# Patient Record
Sex: Female | Born: 1950 | Race: White | Hispanic: No | Marital: Single
Health system: Southern US, Community
[De-identification: ages and names within clinical notes are randomized; demographics above are authoritative.]

---

## 2006-04-01 ENCOUNTER — Emergency Department: Payer: Self-pay | Admitting: General Practice

## 2007-03-22 ENCOUNTER — Emergency Department: Payer: Self-pay | Admitting: Emergency Medicine

## 2009-03-28 ENCOUNTER — Emergency Department: Payer: Self-pay | Admitting: Unknown Physician Specialty

## 2013-03-19 ENCOUNTER — Observation Stay: Payer: Self-pay | Admitting: Student

## 2013-03-19 LAB — COMPREHENSIVE METABOLIC PANEL
Albumin: 2.9 g/dL — ABNORMAL LOW (ref 3.4–5.0)
Alkaline Phosphatase: 120 U/L (ref 50–136)
Bilirubin,Total: 0.2 mg/dL (ref 0.2–1.0)
Calcium, Total: 9.5 mg/dL (ref 8.5–10.1)
Chloride: 99 mmol/L (ref 98–107)
Co2: 26 mmol/L (ref 21–32)
EGFR (African American): >60
Glucose: 315 mg/dL — ABNORMAL HIGH (ref 65–99)
Osmolality: 283 (ref 275–301)
SGOT(AST): 14 U/L — ABNORMAL LOW (ref 15–37)
SGPT (ALT): 19 U/L (ref 12–78)

## 2013-03-19 LAB — CBC
HCT: 38.7 % (ref 35.0–47.0)
MCHC: 33.2 g/dL (ref 32.0–36.0)
MCV: 94 fL (ref 80–100)
Platelet: 267 10*3/uL (ref 150–440)
RBC: 4.13 10*6/uL (ref 3.80–5.20)
RDW: 14.4 % (ref 11.5–14.5)
WBC: 13.1 10*3/uL — ABNORMAL HIGH (ref 3.6–11.0)

## 2013-03-19 LAB — URINALYSIS, COMPLETE
Bilirubin,UR: NEGATIVE
Ketone: NEGATIVE
Nitrite: POSITIVE
Protein: 30
RBC,UR: 2 /HPF (ref 0–5)
Specific Gravity: 1.015 (ref 1.003–1.030)
Squamous Epithelial: 1
WBC UR: 111 /HPF (ref 0–5)

## 2013-03-19 LAB — HEMOGLOBIN A1C: Hemoglobin A1C: 11.6 % — ABNORMAL HIGH (ref 4.2–6.3)

## 2013-03-20 DIAGNOSIS — I059 Rheumatic mitral valve disease, unspecified: Secondary | ICD-10-CM

## 2013-03-20 LAB — CBC WITH DIFFERENTIAL/PLATELET
Basophil #: 0.1 10*3/uL (ref 0.0–0.1)
Basophil %: 0.7 %
Eosinophil %: 0.9 %
HCT: 38.7 % (ref 35.0–47.0)
HGB: 13.1 g/dL (ref 12.0–16.0)
Lymphocyte #: 5.6 10*3/uL — ABNORMAL HIGH (ref 1.0–3.6)
Lymphocyte %: 45.5 %
MCH: 31.6 pg (ref 26.0–34.0)
Monocyte %: 4.7 %
Neutrophil %: 48.2 %
RBC: 4.16 10*6/uL (ref 3.80–5.20)
RDW: 14.4 % (ref 11.5–14.5)
WBC: 12.3 10*3/uL — ABNORMAL HIGH (ref 3.6–11.0)

## 2013-03-20 LAB — LIPID PANEL: Cholesterol: 251 mg/dL — ABNORMAL HIGH (ref 0–200)

## 2013-03-21 LAB — URINE CULTURE

## 2014-11-07 ENCOUNTER — Emergency Department: Payer: Self-pay | Admitting: Emergency Medicine

## 2015-01-06 NOTE — Discharge Summary (Signed)
PATIENT NAME:  Mary Hopkins, Mary Hopkins MR#:  [id number] DATE OF BIRTH:  1950/09/22  DATE OF ADMISSION:  03/19/2013 DATE OF DISCHARGE:  03/20/2013  THE PATIENT WALKED OUT AGAINST MEDICAL ADVICE ON 03/20/2013.   CHIEF COMPLAINT: Right arm and hand weakness, along with slurred speech.   PRIMARY CARE PHYSICIAN: Dr.  Charlies Constable at [facility].   DIAGNOSES AT TIME OF DISCHARGE: 1.  Possible transient ischemic attack. 2.  Uncontrolled diabetes, with a hemoglobin A1c of 11.6.  3.  Hypertension.  4.  Positive urinalysis, possibly from interstitial cystitis.  5.  Hyperlipidemia.  6.  Hypertriglyceridemia.  7.  History of transient ischemic attacks in the past.  8.  History of stroke in the past.  9.  Fibromyalgia.  10.  Arthritis.  11.  Gastroesophageal reflux disease.  12.  Chronic low back pain.  13.  History of nephrolithiasis.  14.  History of peripheral arterial disease, status post stent placement in 02/2013.  15.  History of osteoarthritis.  16.  History of carpal tunnel syndrome.  17.  Ongoing tobacco abuse; smokes 2 packs a day.   MEDICATIONS AT THE TIME OF WALKING OUT AGAINST MEDICAL ADVICE: Atenolol 50 mg daily. Atorvastatin, was increased to 80 mg at bedtime, baclofen 10 mg q.24 h., Cipro 400 mg q.12h., Docusate sodium 100 mg daily, hydrochlorothiazide 25 mg daily, Protonix 40 mg b.i.d., prednisone 40 mg daily, aspirin 81 mg daily, Lovenox 40 mg subcutaneous daily, Lantus 71 units at bedtime, with sliding-scale insulin, metformin 1000 mg b.i.d.     SIGNIFICANT LABS AND IMAGING: Initial creatinine was 0.97, BUN of 19. Initial glucose 315, potassium 3.8. Hemoglobin A1c 11.6. HDL 30, triglycerides 349, LDL 151.   LFTs showed AST 14, albumin of 2.9; otherwise within normal limits. Troponin negative. Initial WBC 13.1, hemoglobin 12.9, platelets 267.   Urine cultures have been positive with Klebsiella, sensitive to ceftriaxone, Cipro, Levaquin and Bactrim. UA was positive with  nitrites, 2+ leukocyte esterase, 2 RBCs, 111 WBC, 1 bacteria.   Echocardiogram showed no source of CVA. EF of 60% to 65%, normal LV systolic function, normal RVSP.  CT of the head without contrast on admission showed no acute intracranial process. There was an old right basilar ganglia lacunar infarct. There was generalized cerebral atrophy. There was periventricular white matter low attenuation likely secondary to microangiopathy.   Repeat CT of the head the following day on July 5th showed a subtle focus of hypoattenuation and subcortical white matter of the left frontoparietal lobe; is not definitely appreciated on prior, though this could be secondary to differences in slight flexion; however the possibility of an acute infarct in the subcortical white matter is raised.   ULTRASOUND: Carotid Dopplers bilaterally shows findings concerning for at least 50 to 75% stenosis in the proximal left internal carotid artery. Further evaluation could be provided with CT angio of the neck.   HISTORY OF PRESENT ILLNESS AND HOSPITAL COURSE: For full details of H and P, please see dictation by Dr. Elpidio Anis on July 4. Briefly this is a 64 year old female with a history of TIA, osteoarthritis, diabetes, hypertension, right carotid artery stenosis, who came in with right upper arm weakness and slurred speech,  3 episodes on-and-off. She had 3 episodes over a 2-hour period, and the symptoms resolved. She was on Plavix, started around 2004 or so. The patient has a history of a right-sided carotid artery stenosis for which she had undergone endarterectomy along with angioplasty in the past. When she came in  she was asymptomatic. [facility] by Dr. Cyril LoosenKinner in the ER, who recommended getting a carotid Doppler. She had a spinal stimulator and could not get an MRI, and initial CAT scan was negative. She was admitted to the hospitalist service for observation with frequent neurology consults, and an  echocardiogram, repeat CAT scan, and ultrasound of the carotids were obtained the results of which are dictated above. The patient was neurologically intact, showing no signs of stroke.   Her labs showed multiple abnormality abnormalities including uncontrolled diabetes with elevated hemoglobin A1c and high triglycerides, and LDL for her goal. She was transitioned to Aggrenox from Plavix, and her simvastatin was increased, however THE PATIENT WALKED OUT AGAINST MEDICAL ADVICE. A vascular consult was placed, however the patient was adamant that she wanted to go and [facility]. Again she walked out against gas medical advice before a full workup was completed. She had been started on Cipro as she had a positive UA and she had mild leukocytosis however, urine culture at the time she  WALKED OUT AGAINST MEDICAL ADVICE. Nonetheless, this was likely an asymptomatic bacteriuria. She has a chronic UTI, she states, and at this point she was not symptomatic, without hematuria or dysuria, or fevers.   Total Time Spent: Thirty-five minutes.     ____________________________ Krystal EatonShayiq Berel Najjar, MD sa:dm D: 03/21/2013 08:04:01 ET T: 03/21/2013 09:49:21 ET JOB#: [id number]  cc: Krystal EatonShayiq Charlie Seda, MD, <Dictator> Dr. Charlies Constableonald Spencer  [facility] Marcelle SmilingSHAYIQ [facility] MD ELECTRONICALLY SIGNED 04/12/2013 14:32

## 2015-01-06 NOTE — H&P (Signed)
PATIENT NAME:  KENNADI, ALBANY MR#:  [id number] DATE OF BIRTH:  November 16, 1950  DATE OF ADMISSION:  03/19/2013  PRIMARY CARE PHYSICIAN: Charlies Constable, MD, at [facility].   CHIEF COMPLAINT: Right arm and hand weakness along with slurred speech.   HISTORY OF PRESENTING ILLNESS: A 64 year old female patient with prior history of TIAs, Takayasu arteritis, diabetes, hypertension and right carotid artery stenosis, presents to the Emergency Room with complaints of 3 episodes of on-and-off right upper arm weakness and slurred speech. She had 3 episodes over a 2-hour period. Presently, symptoms are resolved. She feels back to baseline. Her last similar symptoms were in  2004 when she was started on Plavix. The patient had right carotid artery stenosis, for which she had endarterectomy along with angioplasty. The patient has not had symptoms since. Dr. Cyril Loosen of Emergency Room has gotten in touch with [facility] neurology who suggested patient get carotid Dopplers, as it has been a while since she got this test. The patient has a spinal stimulator, cannot get MRIs. Her CT scan of the head today without contrast showed a small right basal ganglion old lacunar stroke, nothing acute, no masses.   The patient did not have any dysphagia, change in vision, lightheadedness, syncope, dizziness, hearing loss or other focal abnormalities or seizures. No associated nausea, vomiting. No pain.   PAST MEDICAL HISTORY: 1.  Hypertension.  2.  TIAs.  3.  Chronic low back pain.  4.  Type 2 diabetes mellitus.  5.  GERD.  6.  Arthritis.  7.  Recurrent UTIs.  8.  Fibromyalgia.  9.  Nephrolithiasis.  10.  Peripheral arterial disease, status post stent placement in June 2014.  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  12.  Carpal tunnel syndrome.  13.  Tobacco abuse.   FAMILY HISTORY: Diabetes, hypertension.   SOCIAL HISTORY: The patient smokes 2 packs a day. No alcohol. No illicit drugs. Ambulates on her own.   CODE STATUS:  Full code.   ALLERGIES: No known drug allergies.   REVIEW OF SYSTEMS: CONSTITUTIONAL: No fever, fatigue.  EYES: No blurred vision, pain, redness.  EARS, NOSE, THROAT: No tinnitus, ear pain, hearing loss.  RESPIRATORY: No cough, wheeze, hemoptysis.  CARDIOVASCULAR: No chest pain, orthopnea, edema.  GASTROINTESTINAL: No nausea, vomiting, diarrhea, abdominal pain.  GENITOURINARY: No dysuria, hematuria, frequency.  ENDOCRINE: No polyuria, nocturia, thyroid problems. Has diabetes.  HEMATOLOGIC AND LYMPHATIC: No anemia, easy bruising, bleeding.  INTEGUMENTARY: No acne, rash, lesions.  MUSCULOSKELETAL: Has chronic back pain and arthritis. Has a spinal stimulator.  NEUROLOGICAL: Had right upper extremity weakness, slurred speech, which resolved.  PSYCHIATRIC: No anxiety, depression.   HOME MEDICATIONS: Include:  1.  Lansoprazole 30 mg oral 2 times a day.  2.  Prednisone 4 mg oral once a day.  3.  Atenolol 50 mg oral once a day. 4.  Baclofen 10 mg oral once a day in the evening.  5.  Docusate sodium 100 mg oral daily.  6.  Fluconazole 200 mg oral daily as needed.  7.  Hydrochlorothiazide 25 mg once a day.  8.  Insulin glargine 71 units subcutaneous once a day.  9.  Atorvastatin 40 mg oral once a day.  10.  Lorazepam 1 mg oral 2 times a day as needed.  11.  Metformin 1000 mg oral 2 times a day.  12.  Insulin aspart sliding scale. 13.  Paroxetine 20 mg oral once a day.  14.  Plavix 75 mg oral once a day.  PHYSICAL EXAMINATION: VITAL SIGNS: Temperature 99.4, pulse of 68, blood pressure 121/54, saturating 96% on room air. GENERAL: Obese Caucasian female patient lying in bed, comfortable, conversational, cooperative with exam.  PSYCHIATRIC: Alert, oriented x 3. Mood and affect appropriate. Judgment intact.  HEENT: Atraumatic, normocephalic. Oral mucosa moist and pink. External ears and nose normal. No pallor. No icterus. Pupils bilaterally equal and reactive to affect.  NECK: Supple. No  thyromegaly. No palpable lymph nodes. Trachea midline. No JVD. The patient does have right-sided carotid bruit. CARDIOVASCULAR: S1, S2, without any murmurs. Peripheral pulses 2+. No edema.  RESPIRATORY: Normal work of breathing. Clear to auscultation both sides. GASTROINTESTINAL: Soft abdomen, nontender. Bowel sounds present. No organomegaly palpable.  SKIN: Warm and dry. No petechiae, rash, ulcers.  MUSCULOSKELETAL: No joint swelling, redness, effusion of the large joints. Normal muscle tone.  NEUROLOGICAL: Motor strength five 5 out of 5 in upper and lower extremities. Babinskis  downgoing. Cranial nerves II through XII intact. Sensation to fine touch intact all over. Gait not tested.  LYMPHATIC: No cervical or supraclavicular lymphadenopathy.   LABORATORY, DIAGNOSTIC AND RADIOLOGICAL DATA: Glucose of 315, BUN 19, creatinine 0.97, sodium 134, potassium 3.8, GFR greater than 60. AST, ALT, alkaline phosphatase, bilirubin normal. Troponin less than 0.02. WBC 13.1, hemoglobin 12.9, platelets 267. Urinalysis shows 1+ bacteria, 111 WBCs.   EKG shows normal sinus rhythm with T wave inversions in V1 to V5. No ST elevation. No old EKG to compare with.   CT scan of the head shows no acute abnormalities. Has an old lacunar right basal ganglia CVA.   ASSESSMENT AND PLAN: 1.  Right upper extremity weakness and slurred speech in a patient with prior history of transient ischemic attacks and duration over 2 hours,  along with hypertension, diabetes: Is being admitted onto a tele floor for work-up of transient ischemic attack. The patient cannot get an MRI. On discussing with [facility], decision was made to get carotid Dopplers and monitor on the floor. The patient will be admitted onto a tele floor with neuro checks every 4 hours. Symptoms have resolved at this time. I will change her Plavix to Aggrenox 2 times a day. The patient will be continued on her statin medication. Will get  carotid Dopplers and 2-D echocardiogram. The patient does have right-sided carotid bruit. Have asked the patient to have a low threshold to watch out for any new stroke-like symptoms in the future.  2.  EKG changes with T wave inversions in V1 to V5: No old EKG to compare with. The patient does not complain of any shortness of breath or chest pain. She will likely need an elective stress test with her primary care physician.  3.  Hypertension: Well controlled.  4.  Diabetes mellitus, insulin-dependent: Uncontrolled with glucose of 315. Will resume her Lantus. Put her on sliding scale insulin. Also continue the metformin. The patient will be on a diabetic diet. I have counseled her regarding being compliant with her diabetic diet.  5.  Tobacco abuse: The patient smokes 2 packs a day. Counseled her to quit smoking for greater than 3 minutes. She mentions she would like to quit starting today. Will place her on a nicotine patch.  6.  Deep vein thrombosis prophylaxis with Lovenox.   CODE STATUS: Full code.   TIME SPENT: Today on this case was 45 minutes.    ____________________________ Molinda BailiffSrikar R. Nyeema Want, MD srs:jm D: 03/19/2013 17:52:05 ET T: 03/19/2013 19:11:50 ET JOB#: [id number]  cc:  Kristoffer Bala R. Elpidio Anis, MD, <Dictator> [facility] Charlies Constable MD   Wardell Heath West Bali MD ELECTRONICALLY SIGNED 03/19/2013 20:05

## 2015-01-10 ENCOUNTER — Inpatient Hospital Stay
Admit: 2015-01-10 | Disposition: A | Discharge status: Home / Self Care | Payer: Self-pay | Attending: Internal Medicine | Admitting: Internal Medicine

## 2015-01-10 LAB — URINALYSIS, COMPLETE
Bilirubin,UR: NEGATIVE
Glucose,UR: NEGATIVE mg/dL (ref 0–75)
KETONE: NEGATIVE
Nitrite: NEGATIVE
Ph: 7 (ref 4.5–8.0)
Specific Gravity: 1.023 (ref 1.003–1.030)

## 2015-01-10 LAB — COMPREHENSIVE METABOLIC PANEL
ALK PHOS: 91 U/L
ANION GAP: 9 (ref 7–16)
Albumin: 2.8 g/dL — ABNORMAL LOW
BUN: 17 mg/dL
Bilirubin,Total: <0.1 mg/dL — ABNORMAL LOW
CALCIUM: 9.3 mg/dL
CO2: 22 mmol/L
CREATININE: 1.02 mg/dL — AB
Chloride: 101 mmol/L
EGFR (Non-African Amer.): 59 — ABNORMAL LOW
Glucose: 210 mg/dL — ABNORMAL HIGH
POTASSIUM: 4.3 mmol/L
SGOT(AST): 24 U/L
SGPT (ALT): 11 U/L — ABNORMAL LOW
SODIUM: 132 mmol/L — AB
Total Protein: 7.9 g/dL

## 2015-01-10 LAB — TROPONIN I: Troponin-I: <0.03 ng/mL

## 2015-01-10 LAB — CBC
HCT: 23.7 % — ABNORMAL LOW (ref 35.0–47.0)
HGB: 7.1 g/dL — AB (ref 12.0–16.0)
MCH: 22 pg — ABNORMAL LOW (ref 26.0–34.0)
MCHC: 29.9 g/dL — ABNORMAL LOW (ref 32.0–36.0)
MCV: 73 fL — ABNORMAL LOW (ref 80–100)
PLATELETS: 476 10*3/uL — AB (ref 150–440)
RBC: 3.23 10*6/uL — ABNORMAL LOW (ref 3.80–5.20)
RDW: 20.3 % — AB (ref 11.5–14.5)
WBC: 13.5 10*3/uL — ABNORMAL HIGH (ref 3.6–11.0)

## 2015-01-11 LAB — BASIC METABOLIC PANEL
Anion Gap: 7 (ref 7–16)
BUN: 14 mg/dL
CO2: 26 mmol/L
Calcium, Total: 9.5 mg/dL
Chloride: 106 mmol/L
Creatinine: 0.96 mg/dL
EGFR (African American): >60
EGFR (Non-African Amer.): >60
Glucose: 43 mg/dL — ABNORMAL LOW
POTASSIUM: 3.8 mmol/L
Sodium: 138 mmol/L

## 2015-01-11 LAB — CBC WITH DIFFERENTIAL/PLATELET
Basophil #: 0.1 10*3/uL (ref 0.0–0.1)
Basophil %: 0.8 %
Eosinophil #: 0.1 10*3/uL (ref 0.0–0.7)
Eosinophil %: 1.3 %
HCT: 26.2 % — ABNORMAL LOW (ref 35.0–47.0)
HGB: 8.1 g/dL — ABNORMAL LOW (ref 12.0–16.0)
Lymphocyte #: 2.8 10*3/uL (ref 1.0–3.6)
Lymphocyte %: 24.2 %
MCH: 23.1 pg — AB (ref 26.0–34.0)
MCHC: 31 g/dL — AB (ref 32.0–36.0)
MCV: 75 fL — ABNORMAL LOW (ref 80–100)
MONO ABS: 0.7 x10 3/mm (ref 0.2–0.9)
Monocyte %: 5.7 %
Neutrophil #: 7.8 10*3/uL — ABNORMAL HIGH (ref 1.4–6.5)
Neutrophil %: 68 %
PLATELETS: 394 10*3/uL (ref 150–440)
RBC: 3.51 10*6/uL — ABNORMAL LOW (ref 3.80–5.20)
RDW: 20.4 % — ABNORMAL HIGH (ref 11.5–14.5)
WBC: 11.5 10*3/uL — ABNORMAL HIGH (ref 3.6–11.0)

## 2015-01-12 LAB — CBC WITH DIFFERENTIAL/PLATELET
BASOS ABS: 0.1 10*3/uL (ref 0.0–0.1)
Basophil %: 1.1 %
Eosinophil #: 0.1 10*3/uL (ref 0.0–0.7)
Eosinophil %: 0.9 %
HCT: 25.9 % — AB (ref 35.0–47.0)
HGB: 7.9 g/dL — ABNORMAL LOW (ref 12.0–16.0)
LYMPHS PCT: 32.1 %
Lymphocyte #: 3.2 10*3/uL (ref 1.0–3.6)
MCH: 23 pg — ABNORMAL LOW (ref 26.0–34.0)
MCHC: 30.4 g/dL — ABNORMAL LOW (ref 32.0–36.0)
MCV: 76 fL — ABNORMAL LOW (ref 80–100)
MONOS PCT: 5.2 %
Monocyte #: 0.5 x10 3/mm (ref 0.2–0.9)
NEUTROS ABS: 6.1 10*3/uL (ref 1.4–6.5)
Neutrophil %: 60.7 %
Platelet: 362 10*3/uL (ref 150–440)
RBC: 3.43 10*6/uL — ABNORMAL LOW (ref 3.80–5.20)
RDW: 20.3 % — ABNORMAL HIGH (ref 11.5–14.5)
WBC: 10.1 10*3/uL (ref 3.6–11.0)

## 2015-01-12 LAB — BASIC METABOLIC PANEL
Anion Gap: 6 — ABNORMAL LOW (ref 7–16)
BUN: 19 mg/dL
CO2: 27 mmol/L
CREATININE: 1.15 mg/dL — AB
Calcium, Total: 9.2 mg/dL
Chloride: 105 mmol/L
EGFR (Non-African Amer.): 51 — ABNORMAL LOW
GFR CALC AF AMER: 59 — AB
Glucose: 110 mg/dL — ABNORMAL HIGH
Potassium: 4.1 mmol/L
Sodium: 138 mmol/L

## 2015-01-13 LAB — URINALYSIS, COMPLETE
Bilirubin,UR: NEGATIVE
Glucose,UR: NEGATIVE mg/dL (ref 0–75)
KETONE: NEGATIVE
Nitrite: NEGATIVE
PH: 5 (ref 4.5–8.0)
SPECIFIC GRAVITY: 1.014 (ref 1.003–1.030)

## 2015-01-13 LAB — BASIC METABOLIC PANEL
Anion Gap: 7 (ref 7–16)
BUN: 30 mg/dL — ABNORMAL HIGH
CALCIUM: 9.1 mg/dL
CO2: 27 mmol/L
CREATININE: 1.23 mg/dL — AB
Chloride: 106 mmol/L
GFR CALC AF AMER: 54 — AB
GFR CALC NON AF AMER: 47 — AB
Glucose: 159 mg/dL — ABNORMAL HIGH
Potassium: 4.2 mmol/L
Sodium: 140 mmol/L

## 2015-01-13 LAB — HEMOGLOBIN A1C: Hemoglobin A1C: 6.9 % — ABNORMAL HIGH

## 2015-01-14 LAB — URINE CULTURE

## 2015-01-15 NOTE — Consult Note (Signed)
Details:   - GI Note;  Likely d/c tomorrow.  I recommend GI clinic f/u for consideration of colonoscpy to r/o malignancy and CV fistula.   Electronic Signatures: Dow Adolphein, Baylin Gamblin (MD)  (Signed 28-Apr-16 17:16)  Authored: Details   Last Updated: 28-Apr-16 17:16 by Dow Adolphein, Chrissi Crow (MD)

## 2015-01-15 NOTE — Consult Note (Addendum)
PATIENT NAME:  Mary Hopkins, Mary Hopkins MR#:  [id number] DATE OF BIRTH:  [birth year]  INFECTIOUS DISEASE CONSULTATION   DATE OF CONSULTATION:  01/12/2015.  REFERRING PHYSICIAN:  Dr. Amado Coe.  CONSULTING PHYSICIAN:  Stann Mainland. Sampson Goon, MD.  REASON FOR CONSULTATION:  Recurrent urinary tract infection.   HISTORY OF PRESENT ILLNESS:  This is a very pleasant 64 year old female with a history of lupus, Takayasu arteritis, peripheral artery disease, nephrolithiasis, and recurrent urinary tract infections.  She was admitted April 26 with urinary retention, suprapubic pain, and lethargy.  She had a Foley catheter placed.  She had a CT showing bladder wall thickening and inflammatory changes as well as bilateral moderate hydroureteronephrosis.  There was no evidence of obstructing stones at the time.  The patient was admitted.  Has been seen by Dr. Apolinar Junes as well as by GI due to some concern for possible colovesicular fistula.  There did not seem to be evidence of this at that point.   The patient reports she has had at least 5 urinary tract infections since December.  Prior to that, she used to get 1 or 2 a year.  She reports even as a child she had issues.  She had to have her urethra dilated at times, she reports.  Over the last 4 months, she has received multiple courses of antibiotics including Bactrim, Cipro, and Keflex for approximately 10 days at a time.  She says the symptoms would improve, but when she was off of them, they would recur.  She would usually get change in the color of her urine as well as pain.  She has had some low-grade fevers with it.  She has had some diarrhea but has never been admitted for this.    PAST MEDICAL HISTORY:  1. Lupus.  2. Hypertension.  3. Possible Takayasu arteritis.  4. Peripheral artery disease.  5. Chronic back pain.   6. Diabetes.  7. GERD.  8. Arthritis.  9. Fibromyalgia.  10.  Nephrolithiasis.  11.  Recurrent urinary tract infections as above.  12.  Carpal  tunnel syndrome.  13.  Carotid artery stenosis, status post stenting.  14.  Tobacco abuse.  15.  Colonic polyps with dysplasia.   PAST SURGICAL HISTORY:  The patient has had multiple GU surgeries, including bladder sling in the mid 1990s and then excision of the bladder sling due to urinary retention.  She has had InterStim placement.  She has had multiple cystoscopies and colonoscopies.   SOCIAL HISTORY:  She smokes a pack a day.  No alcohol or other drugs.  Lives alone.   FAMILY HISTORY:  Positive for bladder cancer.   ALLERGIES:  LISINOPRIL.  REVIEW OF SYSTEMS:  Eleven systems reviewed and negative except as per HPI.    ANTIBIOTICS SINCE ADMISSION:  Include ceftriaxone.   PHYSICAL EXAMINATION:  VITAL SIGNS:  Temperature 98.5, pulse 59, blood pressure 152/72, respirations 20, saturation 91% on room air.  GENERAL:  She is pleasant, interactive, in no acute distress.  HEENT:  Pupils reactive.  Sclerae anicteric.  Oropharynx clear.  NECK:  Supple.  HEART:  Regular.  LUNGS:  Clear to auscultation bilaterally.  ABDOMEN:  Soft.  She does have some mild tenderness in the suprapubic region.  NEUROLOGIC:  She is alert and oriented x3.  Grossly nonfocal neuro exam.  GENITOURINARY:  She has a Foley catheter in place.  SKIN:  No rashes.    LABORATORY DATA:  On admission April 26, creatinine was 1.02; currently it is 1.15.  Sodium was 132, currently 138.  Other electrolytes were normal.  LFTs showed just a low albumin 2.8, otherwise normal.  White count on admission 13.5 April 26; currently April 28, 10.1.  Hemoglobin 7.9, platelets 362, 000.  Urinalysis on admission:  To numerous to count white cells.  Urine culture is being held for possible pathogens.  I do not have access to her prior cultures at this time but will try to review them.  CT of the abdomen and pelvis April 26:  Marked circumferential bladder wall thickening with surrounding inflammatory changes concerning for cystitis.  There is  heterogeneous material posteriorly in the bladder which may represent infectious disease, however, underlying tumor is not excluded, and then resultant moderate bilateral hydronephrosis.   IMPRESSION:  A 64 year old female with history of multiple medical problems including recurrent urinary tract infections for many years, now with 5 in the last several months.  She has been seen by urology after she was admitted now with urinary retention.  She definitely has a urinary tract infection at this time and culture is pending.  Currently on ceftriaxone and white count is improving and clinically somewhat improved.   RECOMMENDATIONS:   1. I agree with urological recommendations for further workup which can be done as an outpatient when she clinically improves.   2. At this point, I would suggest continuing ceftriaxone.  I would not discharge until urine culture results are available.  At that time, suggest appropriate transition to sensitive oral agent for at least a 14-day course.   She would likely benefit from suppression as well following that.   Thanks for the consult.  I will be glad to follow with you.    ____________________________ Stann Mainlandavid P. Sampson GoonFitzgerald, MD dpf:kc D: 01/12/2015 14:57:21 ET T: 01/12/2015 15:36:30 ET JOB#: [id number]  cc: Stann Mainlandavid P. Sampson GoonFitzgerald, MD, <Dictator> Esbeidy Mclaine Sampson GoonFITZGERALD MD ELECTRONICALLY SIGNED 01/17/2015 10:23

## 2015-01-15 NOTE — Consult Note (Addendum)
PATIENT NAME:  Mary Hopkins, Mary Hopkins MR#:  [id number] DATE OF BIRTH:  [birth year]  DATE OF CONSULTATION:  01/11/2015  REFERRING PHYSICIAN:  Milagros Loll, MD.   CONSULTING PHYSICIAN:  Dow Adolph, MD  REASON FOR CONSULTATION: Bladder wall thickening, recurrent UTI, concern for colovesicular fistula.   HISTORY OF PRESENT ILLNESS: Mary Hopkins is a 64 year old female with a past medical history notable for hypertension, TIA, type 2 diabetes, recurrent UTIs, who is presenting to the Emergency Room for evaluation of suprapubic pain and urinary retention. Mary Hopkins reports that she has had multiple urinary tract infections over the past several weeks. The past few days she has had severe urinary retention and has been only able to have a few drops of urine. This prompted her to present to the Emergency Room.   In the Emergency Room she did have a CT scan showing bladder wall thickening with a possible bladder tumor.   In the setting of this the admitting team was concerned about the possibility of a colovesicular fistula and GI was consulted.   Mary Hopkins denies any overt blood in her stools or any black stools. She did have a colonoscopy 3 years ago with adenomatous polyps and had 3 year recommended followup.  She is late on this. However it was not done because her vascular surgeon did not want her to come off of Plavix at that time for the test.   PAST MEDICAL HISTORY:  1. Hypertension.  2. TIA.   3. DM 2.  4. GERD.  5. Arthritis.  6. Recurrent UTIs.  7. Fibromyalgia.  8. Nephrolithiasis.  9. Peripheral arterial disease status post stent in 2014.  10. Takayasu arteritis.  11. Carpal tunnel syndrome.  12. Tobacco abuse.  13. Right carotid stenosis status post stent.   FAMILY HISTORY: No family history of GI malignancy that she is aware of.   SOCIAL HISTORY: She is an ongoing smoker. No alcohol.   ALLERGIES: LISINOPRIL.   HOME MEDICATIONS:  1. Baclofen 10 mg nightly.   2. Losartan 50 daily.  3. Metoprolol 25 b.i.d.  4. Aspirin 81 daily.  5. Plavix 75 daily.  6. Gabapentin 300 b.i.d.  7. Humalog 10.  8. Hydrochlorothiazide 25 daily.  9. Hydroxychloroquine 200 mg 2 times a day.  10. Lantus 75 daily.  11. Metformin 1000 twice a day.  12. Paroxetine 20 mg 2.5 daily.   REVIEW OF SYSTEMS:   Ten system review is conducted. It is negative except as stated in the HPI.    PHYSICAL EXAMINATION:  VITAL SIGNS: Temperature is 98.3. Pulse is 62. Respirations are 18. Blood pressure 152/75. Pulse oximetry is 92% on room air.   GENERAL: Alert and oriented times 4.  No acute distress. Appears stated age. HEENT: Normocephalic/atraumatic. Extraocular movements are intact. Anicteric. NECK: Soft, supple. JVP appears normal. No adenopathy. CHEST: Clear to auscultation. No wheeze or crackle. Respirations unlabored. HEART: Regular. No murmur, rub, or gallop.  Normal S1 and S2. ABDOMEN: Positive for abdominal obesity. There is also some mild suprapubic tenderness. Soft, nondistended.  Normal active bowel sounds in all four quadrants.  No organomegaly. No masses EXTREMITIES: No swelling, well perfused. SKIN: No rash or lesion. Skin color, texture, turgor normal. NEUROLOGICAL: Grossly intact. PSYCHIATRIC: Normal tone and affect. MUSCULOSKELETAL: No joint swelling or erythema.   LABORATORY DATA: Sodium is 132, potassium 4.3, BUN 17, creatinine 1.02. The liver enzymes, albumin is 2.8, total bilirubin normal, rest of the liver enzymes are normal. White count is 11.5, hemoglobin is  8.1, hematocrit is 26, platelets are 4000, MCV is 75.    The CT scan was as follows: There is a marked circumferential bladder wall thickening with surrounding inflammatory change concerning for cystitis. Heterogeneous material posteriorly in the bladder may represent infectious debris, however underlying tumor is not excluded, resultant  moderate bilateral hydroureteronephrosis.   ASSESSMENT AND PLAN:   Bladder wall thickening, recurrent urinary tract infections, positive fecal occult blood test:  There is no evidence of colonic thickening, inflammation, or diverticulosis on the CT. There is also no evidence of air in the bladder. The lack of colonic involvement would make a colovesicular fistula or colonic malignancy less likely. I do suspect this is likely a primary urinary tract issue. However it would not be unreasonable to perform a flexible sigmoidoscopy or a colonoscopy to rule out the possibility of colonic involvement although I believe this is less likely. The timing of this will depend on urology plan and the timing of a cystoscopy.   RECOMMENDATIONS:  1.  Clear liquids for now.  2.  Continue to monitor hemoglobin.  3.  Possible colonoscopy on Friday, but this will depend on urology's plans and possible scheduling of a cystoscopy. It is also possible to do this study as an outpatient. From a GI standpoint she does not need to remain in the hospital.   Thank you for this consult.     ____________________________ Dow AdolphMatthew Nolita Kutter, MD mr:bu D: 01/11/2015 17:23:39 ET T: 01/11/2015 18:45:00 ET JOB#: [id number]  cc: Dow AdolphMatthew Clinton Wahlberg, MD, <Dictator> Kathalene FramesMATTHEW G Louvenia Golomb MD ELECTRONICALLY SIGNED 01/28/2015 17:19

## 2015-01-15 NOTE — H&P (Signed)
PATIENT NAME:  Mary Hopkins, RABALAIS MR#:  [id number] DATE OF BIRTH:  Feb 07, 1951  DATE OF ADMISSION:  01/10/2015  PRIMARY CARE PROVIDER: Charlies Constable, MD in [facility].   VASCULAR SURGEON: Dr. Carlyon Shadow at [facility].   CHIEF COMPLAINT: Suprapubic pain, weakness, and urinary retention.   HISTORY OF PRESENTING ILLNESS: A 64 year old Caucasian female patient with history of CVA, right carotid stenosis, Takayasu arteritis, and recurrent UTI, presents to the Emergency Room complaining of suprapubic pain, urinary retention, and weakness. The patient mentions that she has had 5 UTIs since December. With last UTI, was treated with ciprofloxacin about 2 weeks prior. Her symptoms did improve, but recurred. At this point, the patient is unable to urinate well. She urinates only a few drops each time, has had increasing suprapubic pain, presented to the Emergency Room. Here, she has been found to have significant anemia with stool positive for blood. A CT scan of the abdomen and pelvis shows bladder wall thickening with a possible bladder tumor.   The patient has not noticed any blood in her stool. She does mention that she tends to have a little blood in her urine whenever she has a urine infection, but no frank blood.   She had a colonoscopy 3 years prior at [facility] when she had polypectomy and was told she had precancerous lesions. She was supposed to get a colonoscopy, but her vascular surgeon did not want to take her off the Plavix for her colonoscopy.   The patient had a Foley catheter placed here in the Emergency Room, which showed significant amount of debris, possibly stool, with extremely cloudy urine.   PAST MEDICAL HISTORY:  1.  Hypertension.  2.  TIA. 3.  Chronic low back pain.  4.  Type 2 diabetes mellitus.  5.  GERD. 6.  Arthritis. 7.  Recurrent UTIs.  8.  Fibromyalgia.  9.  Nephrolithiasis. 10.  Peripheral arterial disease, status post stent placement in June 2014.   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  Carpal tunnel syndrome.  13.  Tobacco abuse.  14.  Right carotid stenosis, status post stent.  15.  Colon polyps with dysplasia.   FAMILY HISTORY: Diabetes and hypertension.   SOCIAL HISTORY: The patient continues to smoke a pack a day. No alcohol. No illicit drugs. Ambulates on her own. Lives alone.   CODE STATUS: Full code.   ALLERGIES: LISINOPRIL.  REVIEW OF SYSTEMS: CONSTITUTIONAL: Complains of fatigue, weakness.  EYES: No blurred vision, pain or redness.  EARS, NOSE, AND THROAT: No tinnitus, ear pain, hearing loss.  RESPIRATORY: No cough, wheeze, hemoptysis.  CARDIOVASCULAR: No chest pain, orthopnea, edema.  GASTROINTESTINAL: Has lower abdominal pain along with some nausea, blood in her stool.  GENITOURINARY: Has urinary retention, dysuria, decreased urine output.  ENDOCRINE: No polyuria, nocturia, thyroid problems. Has diabetes.  HEMATOLOGIC AND LYMPHATIC: Has anemia. No easy bruising, bleeding.  INTEGUMENTARY: No acne, rash, lesion.  MUSCULOSKELETAL: Has chronic back pain, arthritis with spinal stimulator.  NEUROLOGIC: No focal weakness, numbness.  PSYCHIATRIC: No anxiety or depression.   HOME MEDICATIONS:  1.  Baclofen 10 mg at night as needed.  2.  Losartan 50 mg daily.  3.  Metoprolol 25 mg b.i.d.  4.  Aspirin 81 mg daily. 5.  Plavix 75 mg daily.  6.  Gabapentin 300 mg oral 2 times a day.  7.  Humalog 10 units sliding scale.  8.  Hydrochlorothiazide 25 mg daily.  9.  Hydroxychloroquine 200 mg 2 times a  day.  10.  Lantus 75 units subcutaneous once a day.  11.  Metformin 1000 mg oral 2 times a day.  12.  Paroxetine 20 mg 2.5 tablets oral once a day.   PHYSICAL EXAMINATION:  VITAL SIGNS: Shows temperature of 98.7, pulse 80, blood pressure 119/64, saturating 98% on room air.  GENERAL: Obese Caucasian female patient lying in bed, anxious.  HEENT: Atraumatic, normocephalic. Oral mucosa moist and pale. External ears and nose normal.  Pallor positive. No icterus. Pupils bilaterally equal and reactive to light.  NECK: Supple. No thyromegaly. No palpable lymph nodes. Trachea midline. No carotid bruit, JVD. Has scar on the right carotid surgery area.  CARDIOVASCULAR: S1, S2, without any murmurs. Peripheral pulses 2+. No edema.  RESPIRATORY: Normal work of breathing. Clear to auscultation on both sides.  GASTROINTESTINAL: Soft abdomen. Tenderness in the lower abdomen. No rigidity or guarding found. Bowel sounds present. No hepatosplenomegaly palpable.  GENITOURINARY: Has CVA tenderness on the right side. Has suprapubic tenderness. No bladder distention. Foley catheter in place with cloudy urine and debris.  SKIN: No petechiae, rash, ulcers.  MUSCULOSKELETAL: No joint swelling, redness, effusion of the large joints. Normal muscle tone.  NEUROLOGICAL: Motor strength 5/5 in upper extremities.  LYMPHATIC: No cervical lymphadenopathy.   LABORATORY STUDIES AND IMAGING STUDIES: Glucose of 210, BUN 17, creatinine 1.02, sodium 132, potassium 4.3, chloride 101, GFR 59. AST, ALT, alkaline phosphatase, bilirubin normal. Troponin less than 0.02.   WBC 13.5, hemoglobin 7.1, platelets of 476,000 with MCV 73.   CT scan of the abdomen and pelvis with contrast shows moderate bilateral hydroureteronephrosis. Mild circumferential bladder wall thickening with surrounding inflammatory change concerning for cystitis. Heterogenous material posteriorly in the bladder may represent infectious debris or underlying tumor.   ASSESSMENT AND PLAN:  1.  Acute blood loss anemia, likely from gastrointestinal blood loss and also urinary blood loss. 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 We will admit the patient. The patient is getting 1 unit of transfusion here in the  Emergency Room. Hemoglobin will be monitored. We will consult GI for a colonoscopy, although the patient continues to be on Plavix, which is being held at this point. 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 The patient will be started on ceftriaxone.  2.  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  3.  Diabetes mellitus. The patient will be on reduced Lantus and sliding scale insulin.  4.  Hypertension. Continue medications. We will hold hydrochlorothiazide.  5.  Mild hyponatremia secondary to dehydration. Start on IV fluids.  6.  History of carotid stenosis, peripheral arterial disease with stents. The patient is on aspirin and Plavix, which are being held. She has requested that we contact her vascular surgeon, [facility] before stopping her aspirin or Plavix for a prolonged period. I have discussed with her regarding the risk of bleeding at this point, and she has agreed to stop her aspirin and Plavix. Please do contact her vascular surgeon during daytime office hours tomorrow.  7.  Chronic pain syndrome. Continue medications.  8.  Deep venous thrombosis prophylaxis with sequential compression devices.  9. Tobacco abuse-Counselled to quit > 3 minutes spent.  CODE STATUS: Full code.   TIME SPENT TODAY  ON THIS CASE: Forty minutes.   ____________________________ Molinda Bailiff Lily Velasquez, MD srs:TM D: 01/10/2015 18:46:51 ET T: 01/10/2015 19:22:29 ET JOB#: [id number]  cc: Wardell Heath R. Landen Knoedler, MD, <Dictator> Charlies Constable, MD at [facility] Orie Fisherman MD ELECTRONICALLY SIGNED 01/13/2015 16:25

## 2015-01-15 NOTE — Consult Note (Signed)
Chief Complaint:  Subjective/Chief Complaint RUS reviewed, persistent moderate hydronephrosis but Cr stable.  Clinically well.  Catheter in place.   VITAL SIGNS/ANCILLARY NOTES: **Vital Signs.:   29-Apr-16 05:20  Vital Signs Type Routine  Temperature Temperature (F) 98.4  Celsius 36.8  Temperature Source oral  Pulse Pulse 60  Respirations Respirations 20  Systolic BP Systolic BP 408  Diastolic BP (mmHg) Diastolic BP (mmHg) 70  Mean BP 89  Pulse Ox % Pulse Ox % 92  Pulse Ox Activity Level  At rest  Oxygen Delivery Room Air/ 21 %  *Intake and Output.:   Daily 29-Apr-16 07:00  Grand Totals Intake:  480 Output:  1175    Net:  -695 24 Hr.:  -695  Oral Intake      In:  480  Urine ml     Out:  1175  Length of Stay Totals Intake:  2607 Output:  4375    Net:  -1768   Brief Assessment:  GEN well developed, well nourished, no acute distress, obese   Respiratory normal resp effort  clear BS   Gastrointestinal details normal Soft  Nondistended  Foley in place, clear yellow urine   EXTR negative cyanosis/clubbing, negative edema   Lab Results: Routine Chem:  29-Apr-16 04:20   Glucose, Serum  159 (65-99 NOTE: New Reference Range  11/22/14)  BUN  30 (6-20 NOTE: New Reference Range  11/22/14)  Creatinine (comp)  1.23 (0.44-1.00 NOTE: New Reference Range  11/22/14)  Sodium, Serum 140 (135-145 NOTE: New Reference Range  11/22/14)  Potassium, Serum 4.2 (3.5-5.1 NOTE: New Reference Range  11/22/14)  Chloride, Serum 106 (101-111 NOTE: New Reference Range  11/22/14)  CO2, Serum 27 (22-32 NOTE: New Reference Range  11/22/14)  Calcium (Total), Serum 9.1 (8.9-10.3 NOTE: New Reference Range  11/22/14)  Anion Gap 7  eGFR (African American)  54  eGFR (Non-African American)  47 (eGFR values <67mL/min/1.73 m2 may be an indication of chronic kidney disease (CKD). Calculated eGFR is useful in patients with stable renal function. The eGFR calculation will not be reliable in  acutely ill patients when serum creatinine is changing rapidly. It is not useful in patients on dialysis. The eGFR calculation may not be applicable to patients at the low and high extremes of body sizes, pregnant women, and vegetarians.)   Assessment/Plan:  Invasive Device Daily Assessment of Necessity:  Does the patient currently have any of the following indwelling devices? foley   Indwelling Urinary Catheter continued, requirement due to, urinary retention   Assessment/Plan:  Assessment 64yo female with urinary tract infection, urinary retention, bilateral hydronephrosis, and significant bladder wall thickening and inflammation on CT scan.  Bilateral hydronephrosis most likely related to retention but could represent bilateral UVJ obstruction from either thickened bladder wall or tumor.  Cr stable. RUS shows stable hydro but stable cR.   Plan Plan:  1)  Maintain Foley x 1 week.  F/u with [facility] as scheduled within 1-2 weeks. 2)  Recommend outpatient cystoscopy to evaluated for possible bladder tumor.  3)  Continue antibiotics and adjust as needed pending culture results.    4) OK to discharge from GU perspective with close f/u   Electronic Signatures: Sherlynn Stalls (MD)  (Signed 29-Apr-16 08:37)  Authored: Chief Complaint, VITAL SIGNS/ANCILLARY NOTES, Brief Assessment, Lab Results, Assessment/Plan   Last Updated: 29-Apr-16 08:37 by Sherlynn Stalls (MD)

## 2015-01-15 NOTE — Consult Note (Signed)
Chief Complaint:  Subjective/Chief Complaint 64yo female with complex GU history who presented to the ER with urinary retention, suprapubic pain and lethargy.  CT showed circumferential bladder wall thickening with surrounding inflammatory changes as well as moderate bilateral hydroureteronephrosis without evidence of obstructing stones.  History of recurrent UTIs.  Antibiotics were initiated for UTI and Foley catheter was placed which has been is draining well.  She is a current patient with [facility].   VITAL SIGNS/ANCILLARY NOTES: **Vital Signs.:   28-Apr-16 05:15  Vital Signs Type Routine  Temperature Temperature (F) 97.6  Celsius 36.4  Temperature Source oral  Pulse Pulse 57  Respirations Respirations 20  Systolic BP Systolic BP 185  Diastolic BP (mmHg) Diastolic BP (mmHg) 62  Mean BP 86  Pulse Ox % Pulse Ox % 93  Pulse Ox Activity Level  At rest  Oxygen Delivery Room Air/ 21 %  *Intake and Output.:   Daily 28-Apr-16 07:00  Grand Totals Intake:  1810 Output:  2300    Net:  -490 24 Hr.:  -490  Oral Intake      In:  1810  Urine ml     Out:  2300  Length of Stay Totals Intake:  2127 Output:  3200    Net:  -1073   Brief Assessment:  GEN no acute distress   Respiratory normal resp effort   Gastrointestinal details normal Soft  Nontender  Nondistended   EXTR negative cyanosis/clubbing   Additional Physical Exam Urine clear   Lab Results:  Routine Chem:  27-Apr-16 06:48   Glucose, Serum  43 (65-99 NOTE: New Reference Range  11/22/14)  BUN 14 (6-20 NOTE: New Reference Range  11/22/14)  Creatinine (comp) 0.96 (0.44-1.00 NOTE: New Reference Range  11/22/14)  Sodium, Serum 138 (135-145 NOTE: New Reference Range  11/22/14)  Potassium, Serum 3.8 (3.5-5.1 NOTE: New Reference Range  11/22/14)  Chloride, Serum 106 (101-111 NOTE: New Reference Range  11/22/14)  CO2, Serum 26 (22-32 NOTE: New Reference Range  11/22/14)  Calcium (Total), Serum 9.5  (8.9-10.3 NOTE: New Reference Range  11/22/14)  Anion Gap 7  eGFR (African American) >60  eGFR (Non-African American) >60 (eGFR values <99m/min/1.73 m2 may be an indication of chronic kidney disease (CKD). Calculated eGFR is useful in patients with stable renal function. The eGFR calculation will not be reliable in acutely ill patients when serum creatinine is changing rapidly. It is not useful in patients on dialysis. The eGFR calculation may not be applicable to patients at the low and high extremes of body sizes, pregnant women, and vegetarians.)  Routine Hem:  27-Apr-16 06:48   WBC (CBC)  11.5  RBC (CBC)  3.51  Hemoglobin (CBC)  8.1  Hematocrit (CBC)  26.2  Platelet Count (CBC) 394  MCV  75  MCH  23.1  MCHC  31.0  RDW  20.4  Neutrophil % 68.0  Lymphocyte % 24.2  Monocyte % 5.7  Eosinophil % 1.3  Basophil % 0.8  Neutrophil #  7.8  Lymphocyte # 2.8  Monocyte # 0.7  Eosinophil # 0.1  Basophil # 0.1 (Result(s) reported on 11 Jan 2015 at 08:26AM.)   Radiology Results: CT:    26-Apr-16 16:31, CT Abdomen and Pelvis With Contrast  CT Abdomen and Pelvis With Contrast   REASON FOR EXAM:    (1) pain suprapubic/lower abd.  Remote hx of   hysterectomy. Noted acute anemia re  COMMENTS:       PROCEDURE: CT  - CT ABDOMEN / PELVIS  W  - Jan 10 2015  4:31PM     CLINICAL DATA:  Suprapubic/lower abdominal pain. Recent urinary  tract infections. Difficulty urinating. Recent diarrhea and  weakness.    EXAM:  CT ABDOMEN AND PELVIS WITH CONTRAST    TECHNIQUE:  Multidetector CT imaging of the abdomen and pelvis was performed  using the standard protocol following bolus administration of  intravenous contrast.    CONTRAST:  100 mL Omnipaque 300    COMPARISON:  None.    FINDINGS:  Visualized lung bases are clear.    Small stones are noted in the gallbladder. No biliary dilatation is  seen. No focal liver lesion is identified. There is very mild  bilateral adrenal  thickening. The spleen and pancreas are  unremarkable. There is moderate bilateral hydroureteronephrosis. No  urinary tract calculi are identified. 5 mm hypodensity in the lower  pole of the left kidney likely represents a cyst. Mildly lobular  contour of the right greater than left kidneys may indicate areas of  scarring.    Oral contrast is present in nondilated loops of small bowel without  evidence obstruction colon is nondilated. No bowel wall thickening  is identified.    There is marked circumferential bladder wall thickening with  surrounding fat stranding. Heterogeneous material is present  posteriorly in the bladder with attenuation slightly greater than  fluid. The uterus is absent. Moderate atherosclerotic disease is  noted of the abdominal aorta its major branch vessels. No free fluid  or enlarged lymph nodes are identified. Mild thoracolumbar  spondylosis is noted. A sacral nerve stimulator is noted.   IMPRESSION:  1. Marked circumferential bladder wall thickening with surrounding  inflammatory change, concerning for cystitis. Heterogeneous material  posteriorly in the bladder may represent infectious debris, however  underlying tumor is not excluded.  2. Resultant moderate bilateral hydroureteronephrosis.      Electronically Signed    By: Logan Bores    On: 01/10/2015 16:47         Verified By: Ferol Luz, M.D.,   Assessment/Plan:  Invasive Device Daily Assessment of Necessity:  Does the patient currently have any of the following indwelling devices? foley   Indwelling Urinary Catheter continued, requirement due to, urinary retention   Assessment/Plan:  Assessment 64yo female with urinary tract infection, urinary retention, bilateral hydronephrosis, and significant bladder wall thickening and inflammation on CT scan.  Bilateral hydronephrosis most likely related to retention but could represent bilateral UVJ obstruction from either thickened bladder wall  or tumor.  Cr has not significantly decreased since catheter placement.  Today with slight increase in Cr from 0.96 to 1.15.  Good urine output. VSS.   Plan Plan:  1)  Maintain Foley x 1 week.  F/u with [facility] as scheduled. 2)  Renal US 48hrs after Foley catheter placement to assess for any resolution of bilateral hydronephrosis.  3)  Recommend outpatient cystoscopy to evaluated for possible bladder tumor.  4)  Continue antibiotics and adjust as needed pending culture results.   May need ID consult considering recurrent UTIs. 5)  May need CT Cystogram in the future to evaluated for possible colovesical fistula.   Electronic Signatures for Addendum Section:  Sherlynn Stalls (MD) (Signed Addendum 29-Apr-16 08:13)  Patient seen and examined on 01/12/15.  Agree with above.   Electronic Signatures: Sherlynn Stalls (MD)  (Signed 29-Apr-16 08:13)  Co-Signer: Chief Complaint, VITAL SIGNS/ANCILLARY NOTES, Brief Assessment, Lab Results, Radiology Results, Assessment/Plan Herbert Moors (NP)  (Signed 28-Apr-16 09:46)  Authored: Chief Complaint, VITAL SIGNS/ANCILLARY NOTES, Brief Assessment, Lab Results, Radiology Results, Assessment/Plan   Last Updated: 29-Apr-16 08:13 by Sherlynn Stalls (MD)

## 2015-01-15 NOTE — Consult Note (Signed)
PATIENT NAME:  Mary Hopkins, Mary Hopkins MR#:  [id number] DATE OF BIRTH:  [birth year]  DATE OF CONSULTATION:  01/11/2015  REFERRING PHYSICIAN:   CONSULTING PHYSICIAN:  Claris GladdenAshley J. Takayla Baillie, MD  REQUESTING PHYSICIAN:  Milagros LollSrikar Sudini, MD  REASON FOR CONSULTATION: Bilateral hydronephrosis, recurrent UTIs, urinary retention.   HISTORY OF PRESENT ILLNESS: This is a 64 year old female with multiple medical problems and a complex GU history who presented to the Emergency Room with urinary retention, suprapubic pain, and overall lethargy. A Foley catheter was placed following her admission and she did have a CT scan of the abdomen and pelvis which showed circumferential bladder wall thickening with surrounding inflammatory changes as well as moderate bilateral hydroureteronephrosis without any evidence of obstructing stones. There was also some heterogenous material within the bladder prior to catheter placement. The patient reports that she has had recurrent urinary tract infections since December, at least 5, and has been treated on multiple courses of extended antibiotics by her PCP for the symptoms. She reports that after completing a course of antibiotics, her symptoms would resolve for a day or two but then recur. Her symptoms including foul-smelling urine, suprapubic discomfort, lower abdominal pain, and this last time urinary retention. She has also had some loose stools over the past week or so. She denies gross hematuria or dysuria. She denies fecaluria for pneumaturia. She denies a history of recent episodes of diverticulitis.   The patient does report a long GU history managed at [facility]. She underwent a bladder sling placement in the mid 1990s for stress urinary incontinence and ultimately developed urinary retention. She underwent incision of the sling x2 shortly thereafter and was briefly on self-catheterization for a period of time. More recently, she underwent what sounds like InterStim   placement at  [facility] 5 years ago and most recently re-established care at [facility] 1-2 years ago, at which time she had her InterStim device checked as well as a cystoscopy and other management for urinary tract infections and urge incontinence. She has an [facility] next week.   PAST MEDICAL HISTORY: 1. Hypertension.  2. TIA. 3. Chronic back pain.  4. Diabetes type 2.  5. GERD.  6. Arthritis.  7. Recurrent UTIs. 8. Fibromyalgia.  9. Nephrolithiasis.  10. 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 12. Carpal tunnel.  13. Carotid stenosis status post stent.  14. Tobacco abuse.  15. Colon polyps with dysplasia.   FAMILY HISTORY: She denies a history of bladder cancer.   SOCIAL HISTORY: She smokes a pack a day. No alcohol or any other drugs.    ALLERGIES:   LISINOPRIL.   MEDICATIONS: Please see  admission H and P for complete list of prehospital medications.   REVIEW OF SYSTEMS:  An extensive 12 point review of systems and is otherwise negative other than as per HPI except chronic back pain, anemia, nausea, blood in her stool.   PHYSICAL EXAMINATION:    VITAL SIGNS: Temperature 98.3, pulse 62, blood pressure 152/75, respirations 20, and 92%  on room air.  GENERAL: No acute distress, alert and oriented, sitting in hospital bed.  HEENT: Moist mucous membranes.  NECK: Supple. No masses. Trachea is midline.  CHEST: No increased work of breathing or use of accessory muscles.  CARDIOVASCULAR: No lower extremity edema bilaterally. ABDOMEN:  Mildly diffusely tender in all 4 quadrants. No rebound or guarding. No CVA tenderness bilaterally.  GENITOURINARY: Foley catheter in place draining cloudy urine. No significant debris noted. SKIN:  No significant rashes  or bruises.  LYMPHATIC: No axillary or inguinal lymphadenopathy.   PSYCHIATRIC:  Appropriate affect, alert, and interactive.  NEUROLOGIC:   Alert and oriented x3. Cranial nerves grossly intact. No focal deficits.  Moving all 4  extremities.   LABORATORY DATA:  From the time of admission shows a white blood cell count of 13.5, which is improved to 11.5 since admission.  She also had an elevated platelet count to 473,000 which is also improved. Creatinine 1.02 on admission, currently 0.96. UA is turbid yellow, negative nitrites, pH is 7.0, specific gravity is 1.023, white blood cells too numerous to count, epithelials 6-30, red blood cells 6-30, white blood cell clumps present. Urine culture is pending.   IMAGING: A CT scan with contrast, as per HPI.  This is reviewed personally by myself.   ASSESSMENT AND PLAN: This is a 64 year old female with urinary tract infection, urinary retention, bilateral hydroureteronephrosis, and significant bladder wall thickening and inflammation on CT scan. I have discussed the findings in detail today with the patient. I suspect that her bilateral hydroureteronephrosis is related to her retention and infection and hopefully should improve with placement of Foley catheterization. Alternatively, she could have bilateral ureterovesical junction obstruction from either a thickened bladder wall or tumor. Her creatinine is preserved. It is unclear whether the bladder wall thickening is acute or chronic but I suspect there is a component of each given her history.   RECOMMENDATIONS: 1. Maintain Foley catheter x1 week, the patient may have a voiding trial at her urologist appointment which is already scheduled for next week at [facility].  2. Recommend follow-up renal ultrasound in 48 hours after Foley catheter placement to assess for any resolution of her bilateral hydroureteronephrosis.  3. Recommend outpatient cystoscopy to evaluate her bladder following successful voiding trial to assess for any possible tumor, she is at risk for bladder malignancy given her history of chronic and continued smoking.  4. Agree with IV antibiotics and adjust as needed pending urine culture. Pending these results, she  may benefit from an infectious disease consultation given her recurrent, persistent urinary tract infection.  5. The patient may also benefit from outpatient CT cystogram to evaluate for possible colovesical fistula, although she denies any pneumaturia or fecaluria.   Urology will continue to follow this patient. Thank you for involving me in the care, and page  with any questions or concerns.     ____________________________ Claris Gladden, MD ajb:tr D: 01/11/2015 17:10:00 ET T: 01/11/2015 18:01:40 ET JOB#: [id number]  cc: Claris Gladden, MD, <Dictator> Claris Gladden MD ELECTRONICALLY SIGNED 01/12/2015 16:46

## 2015-01-16 NOTE — Discharge Summary (Addendum)
PATIENT NAME:  Mary Hopkins, SIDENER MR#:  [id number] DATE OF BIRTH:  [birth year]  DATE OF ADMISSION:  01/10/2015 DATE OF DISCHARGE:  01/13/2015  DISCHARGE DIAGNOSES:  1.  Acute cystitis.  2.  Acute blood loss anemia.  3.  Urinary retention.  4.  Moderate bilateral hydronephrosis.   CONSULTATIONS:  1.  Urology, Claris Gladden, MD.  2.  Gastroenterology.  3.  Stann Mainland. Sampson Goon, MD; infectious disease   CODE STATUS:  FULL CODE.   CONDITION AT THE TIME OF DISCHARGE:  Satisfactory.   DISCHARGE MEDICATIONS: 1.  Losartan 50 mg 1 tablet p.o. once daily in a.m.  2.  Baclofen 10 mg p.o. once daily as needed.  3.  Atenolol 50 mg p.o. once daily.  4.  Hydroxychloroquine 200 mg 1 tablet p.o. 2 times a day. 5.  Plavix 75 mg 1 tablet p.o. once daily. 6.  Paroxetine 20 mg 2-1/2 tablets once daily in a.m.  7.  Gabapentin 300 mg 1 capsule p.o. 3 times a day.  8.  Humalog 10 units subcutaneously 3 times a day before meals.  9.  Lantus 75 units subcutaneously once daily at bedtime.  10.  Tylenol 325 mg 2 tablets p.o. every 4 hours as needed for mild pain or temperature greater than 100.4.  11.  Pantoprazole 40 mg p.o. once daily.  12.  Colace 100 mg p.o. 2 times a day as needed.  13.  Percocet 325/5 mg 1 tablet p.o. every 4 hours as needed for moderate pain to severe pain.  14.  Augmentin 875/125 mg tablet p.o. every 12 hours for 7 days.  15.  Florastor 250 mg 1 capsule p.o. 2 times a day.  16.  Feosol caplet 1 tablet p.o. 2 times a day.  17.  Discontinued hydrochlorothiazide, metformin, and aspirin for now.   DISPOSITION:  Discharging home with home health with nurse and aide. The patient is to continue Foley catheter with leg bag for the next 7 to 10 days until seen by urology.   DIET:  Low-sodium, low-fat, carb control, regular consistency.   FOLLOWUP APPOINTMENTS:  With primary care physician in 1 week. Infectious disease, Dr. Sampson Goon, on May 2 at 8:45 a.m. as scheduled. Followup with  [facility] within 1 week, Dr. Raoul Pitch. Follow up with gastroenterology as an outpatient in 2 weeks for outpatient colonoscopy to rule out malignancy versus colovesical fistula. Holding aspirin, hydrochlorothiazide, and metformin until seen by infectious disease, Dr. Sampson Goon on May 2. Dr. Sampson Goon is to followup on the current urine culture and repeat urine culture results and to consider repeat Chem-8 on May 2.   BRIEF HISTORY AND PHYSICAL AND HOSPITAL COURSE:  The patient is a 64 year old female who came into the ED with a chief complaint of suprapubic pain, weakness, and urinary retention.  She sees urology at [facility] and has a history of recurrent UTIs in the past. She was treated with ciprofloxacin for 2 weeks prior to this admission. CAT scan of the abdomen and pelvis has revealed thickening with possible bladder tumor. Please review history and physical for details. The patient was admitted to the hospital with acute blood loss anemia and recurrent UTIs with urinary retention.  1.  Recurrent urinary tract infections with suprapubic pain and urinary retention:  Renal ultrasound has revealed hydroureteronephrosis with concern about colovesical fistula versus malignancy. The patient was treated with IV antibiotics. Initial urine culture has revealed greater than 100,000 colonies of possible enterococcus, but the final culture results are not available  at this time. Microbiology also has reported that the patient has 3 other organisms which are less than 10,000 colonies. This was discussed with infectious disease, Dr. Sampson GoonFitzgerald. Repeat urinalysis and urine culture was done today. As the patient wants to go home, he has recommended to discharge the patient with p.o. Augmentin for coverage for enterococcus after giving 1 dose of IV Unasyn. Clinically the patient's condition is much better. Repeat renal ultrasound has revealed moderate hydronephrosis. The patient is cleared from urologist, Dr. Delana MeyerBrandon's  standpoint to be discharged home with continuation of the Foley catheter until [facility]. The patient is clinically better. Discharging home with a Foley catheter and will continue p.o. Augmentin for 7 days. The patient is to follow up with Dr. Sampson GoonFitzgerald on May 2 as scheduled at 8:45 a.m.   2.  Acute blood loss anemia, likely from GI bleed and also with urinary blood loss:  Major concern is colovesical fistula versus malignancy. She had a precancerous lesion on colonoscopy 3 years ago. She received 1 unit of blood transfusion in the Emergency Department. Hemoglobin was monitored closely. The patient was evaluated by gastroenterology. As hemoglobin was somewhat stable, they have recommended patient to follow up with them as an outpatient for  outpatient colonoscopy in a week. The plan is to rule out malignancy versus colovesical fistula. Initially, aspirin and Plavix were held, but subsequently the Plavix was resumed after discussing with GI who are agreeable. At this point I am not resuming aspirin.  3.  Diabetes mellitus:  Continue her home dose Lantus and sliding scale insulin.  4.  Hypertension:  Continue her home medications. We will hold hydrochlorothiazide in view of mild renal insufficiency. We are also holding metformin, which can be resumed by Dr. Sampson GoonFitzgerald if repeat BMP on May 2 is in the normal range.  5.  History of carotid artery stenosis/peripheral artery disease with stent:  Resumed Plavix. We will hold off on aspirin. The patient can follow up with [facility] Carly[facility] scheduled. Dr. Sampson GoonFitzgerald can consider resuming  aspirin if renal function and hemoglobin are stable.   DISCHARGE PHYSICAL EXAMINATION:  VITAL SIGNS:  Temperature 98.6, pulse 57 to 60, respirations 20, blood pressure 128/70, pulse oximetry is 92% at rest.  GENERAL APPEARANCE:  Not in acute distress. Moderately built and nourished.  HEENT:  Normocephalic, atraumatic. Pupils are equally reacting to  light and accommodation. No scleral icterus. No conjunctival injection. No sinus tenderness. No postnasal drip. Moist mucous membranes.  NECK:  Supple. No JVD. No thyromegaly. Range of motion is intact.  LUNGS:  Clear to auscultation bilaterally. No accessory muscle use and no anterior chest wall tenderness on palpation.  CARDIAC:  S1, S2 normal. Regular rate and rhythm. No murmurs.  GASTROINTESTINAL:  Soft. Bowel sounds are positive in all 4 quadrants. Minimal tenderness is present in the lower abdominal area.  NEUROLOGIC:  Awake, alert, oriented x 3. Cranial nerves II through XII are grossly intact. Motor and sensory are intact. Reflexes are 2+.  EXTREMITIES:  No edema. No cyanosis. No clubbing.  SKIN:  Warm to touch. Normal turgor. No rashes. No lesions.  MUSCULOSKELETAL:  No joint effusion, tenderness, or edema.  PSYCHIATRIC:  Normal mood and affect.   LABORATORY DATA  Sodium and potassium are normal. Calcium is 6.9. On April 28, hemoglobin 7.9, hematocrit 25.9. Initial urinalysis on April 26, yellow in color, turbid in appearance, nitrites negative, leukocyte esterase 3+, WBCs too numerous to count. Repeat urinalysis  on April 29, yellow in color, hazy in appearance, leukocyte esterase 3+, WBCs too numerous to count. Ultrasound of the kidneys bilateral performed on April 20:  Marked bilateral hydronephrosis. CAT scan of the abdomen and pelvis with contrast:  Mild circumferential bladder wall thickening with surrounding inflammatory change concerning for cystitis.  Heterogenous material posteriorly in the bladder may represent infectious debris; however, underlying tumor is not excluded. Resultant moderate bilateral hydroureteronephrosis.   Diagnoses and plan of care were discussed with the patient and all questions were answered. Discharging home with home health and Foley catheter with leg bag.   TOTAL TIME SPENT ON DISCHARGE: 45 minutes.   ____________________________ Ramonita Lab,  MD ag:TT D: 01/13/2015 16:11:54 ET T: 01/13/2015 23:27:04 ET JOB#: [id number]  cc: Ramonita Lab, MD, <Dictator> Naida Sleight, MD Claris Gladden, MD Soyla Dryer. Raoul Pitch, MD Stann Mainland. Sampson Goon, MD Ramonita Lab MD ELECTRONICALLY SIGNED 01/25/2015 14:24

## 2015-05-18 DEATH — deceased

## 2015-05-26 IMAGING — CT CT ABD-PELV W/ CM
1 of 3 series · 13 of 32 positions shown, 19 images · IV contrast (omnipaque)
Comparison: None.

CLINICAL DATA: Suprapubic/lower abdominal pain. Recent urinary
tract infections. Difficulty urinating. Recent diarrhea and
weakness.

EXAM:
CT ABDOMEN AND PELVIS WITH CONTRAST
TECHNIQUE: Multidetector CT imaging of the abdomen and pelvis was performed
using the standard protocol following bolus administration of
intravenous contrast.
CONTRAST:  100 mL Omnipaque 300

[Series 2: routine abd pel with · axial · 0.78mm/px · z∈[-898,-508]mm · 13 of 92 slices shown, 19 images]
[im 7/92  soft-tissue]
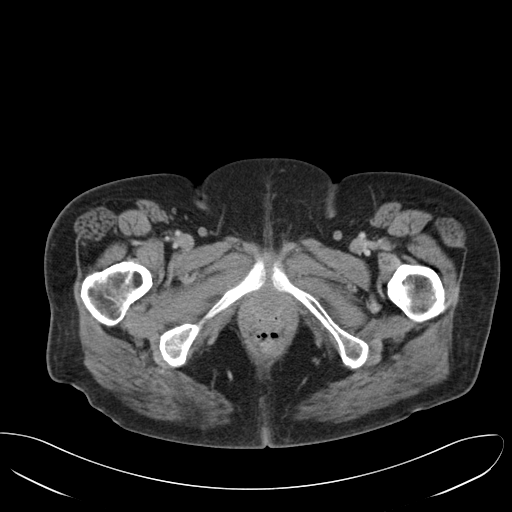
[im 7/92  bone]
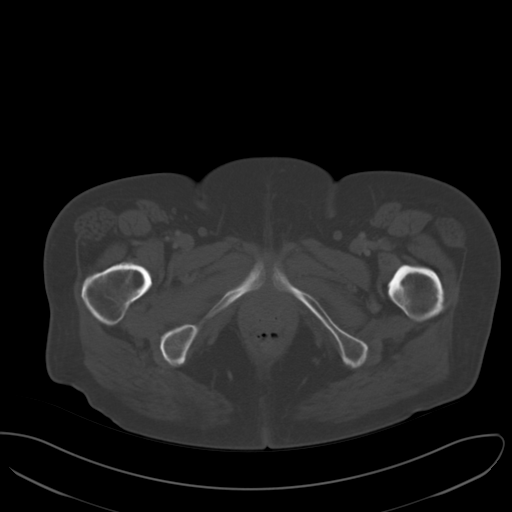
[im 13/92  soft-tissue]
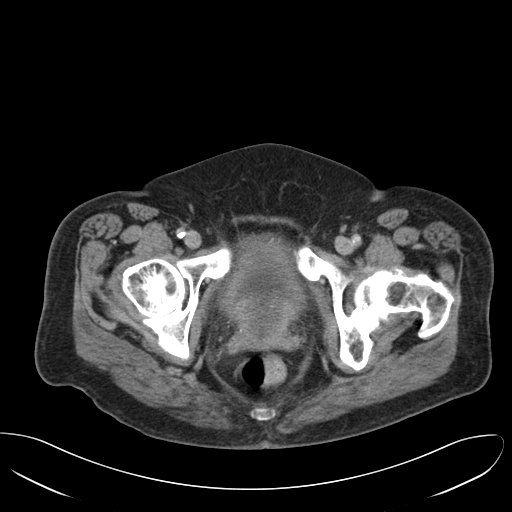
[im 19/92  soft-tissue]
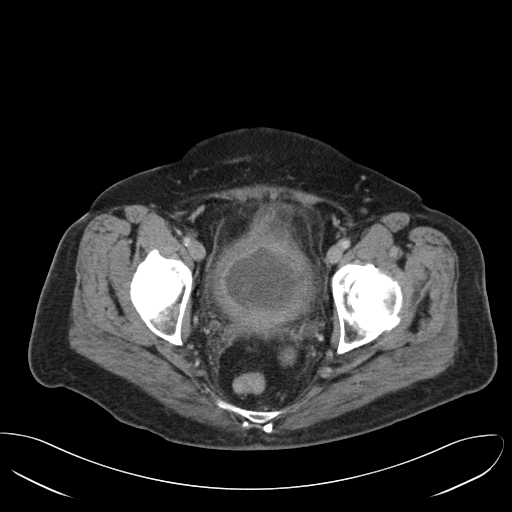
[im 25/92  soft-tissue]
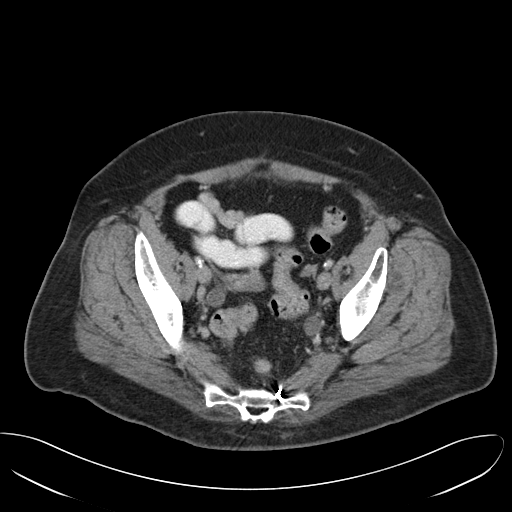
[im 31/92  soft-tissue]
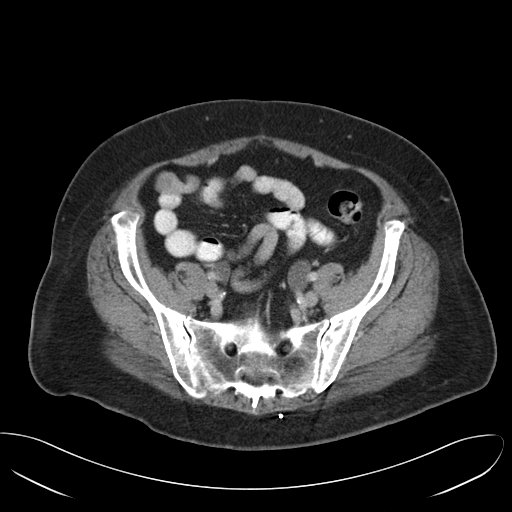
[im 37/92  soft-tissue]
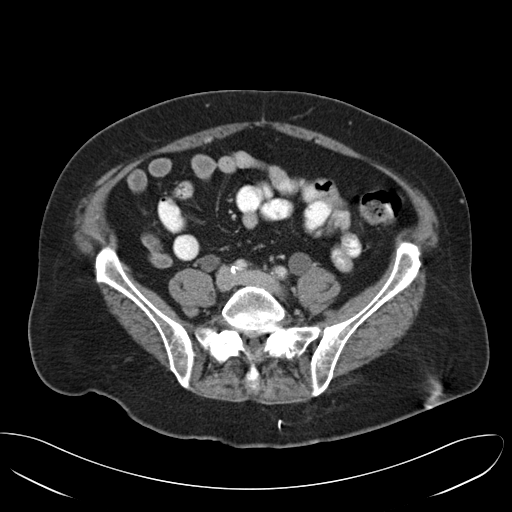
[im 49/92  soft-tissue]
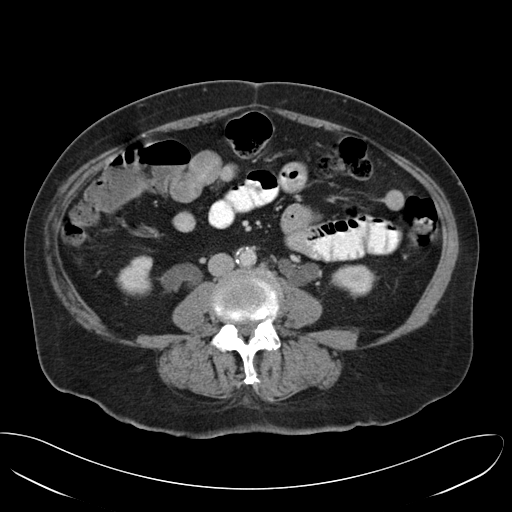
[im 55/92  soft-tissue]
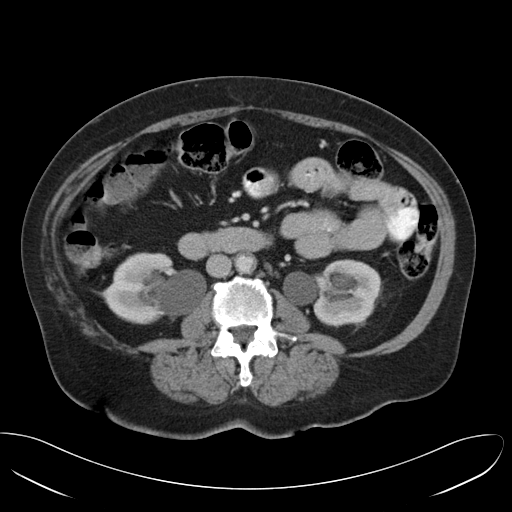
[im 61/92  soft-tissue]
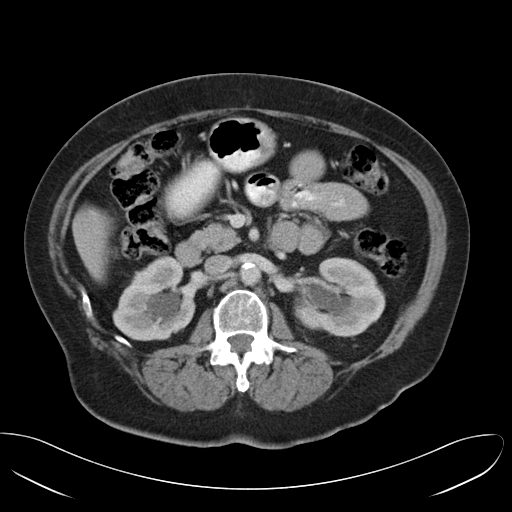
[im 61/92  bone]
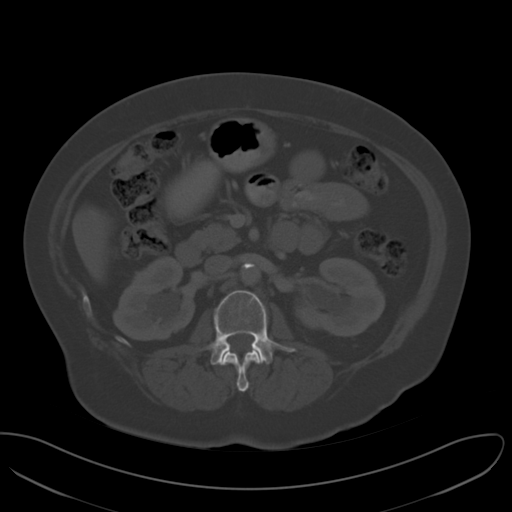
[im 67/92  soft-tissue]
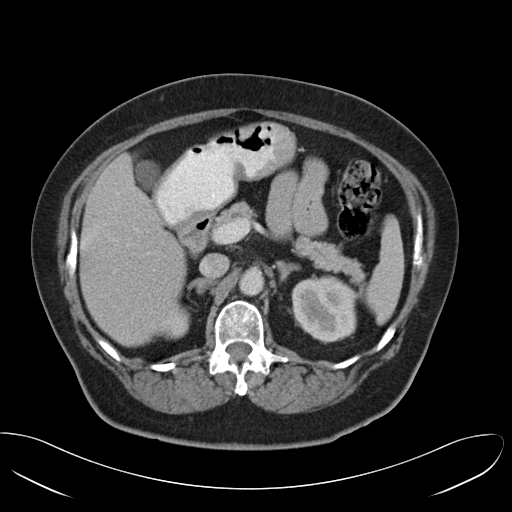
[im 67/92  lung]
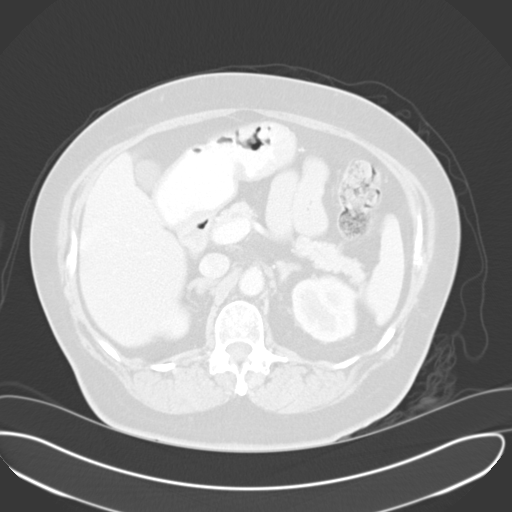
[im 73/92  soft-tissue]
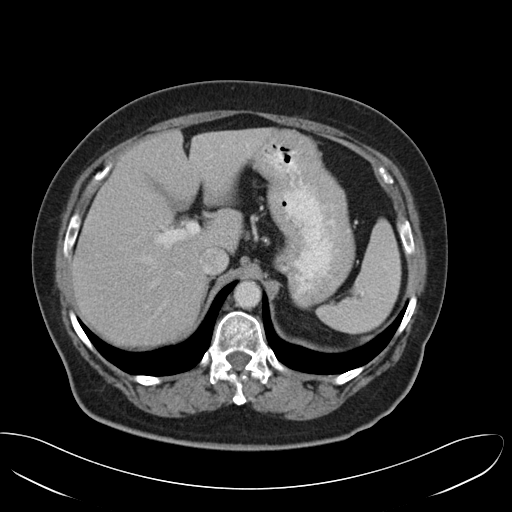
[im 73/92  lung]
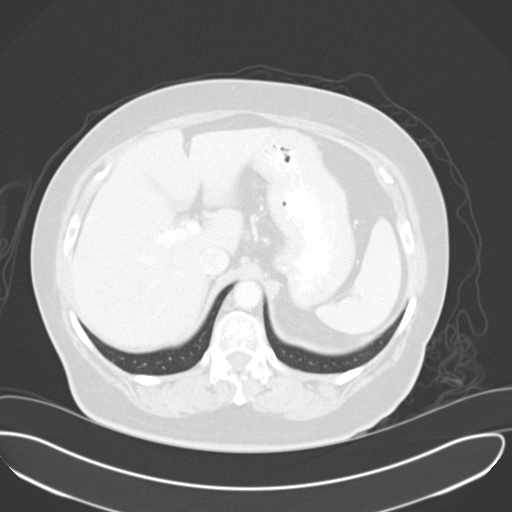
[im 79/92  soft-tissue]
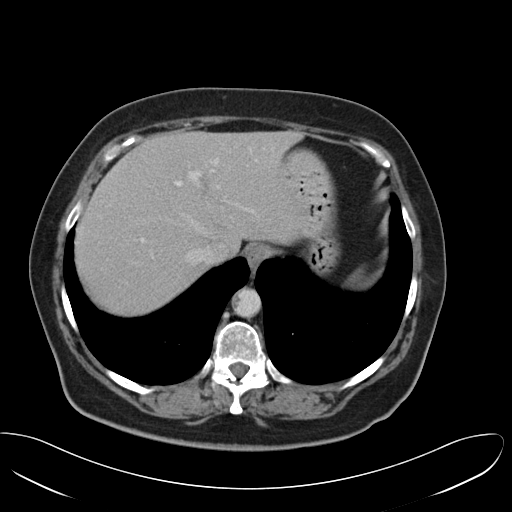
[im 79/92  lung]
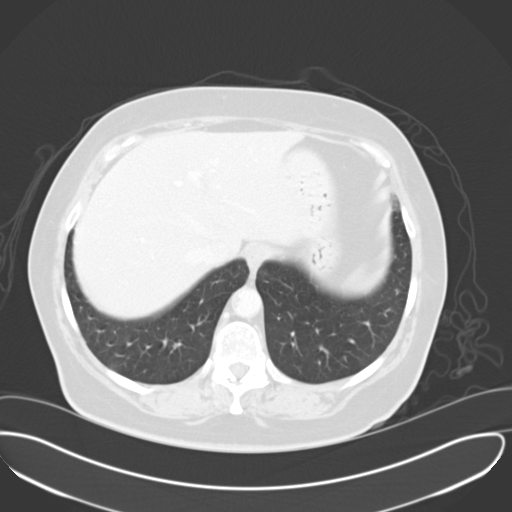
[im 85/92  soft-tissue]
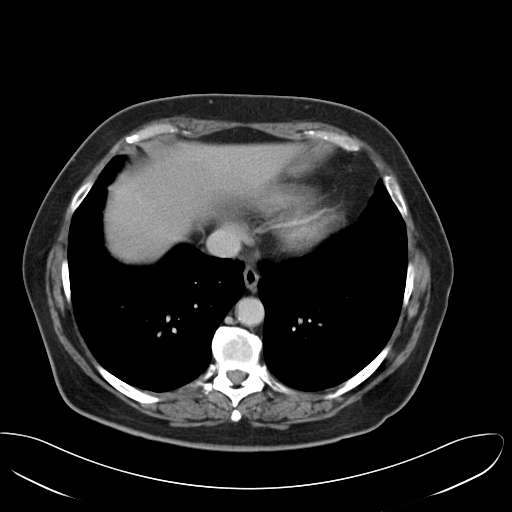
[im 85/92  lung]
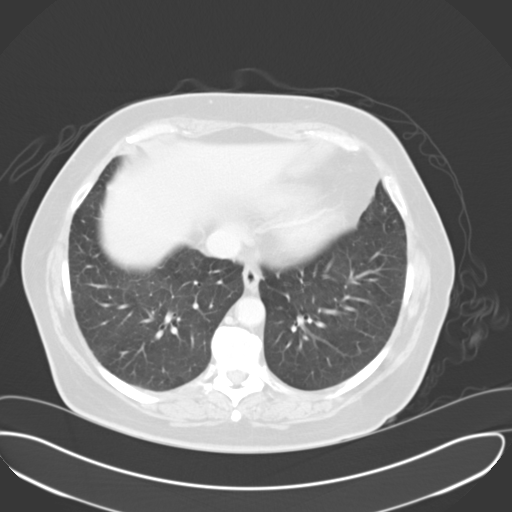

[13 of 32 positions shown; findings below may reference images not displayed]

FINDINGS: Visualized lung bases are clear.

Small stones are noted in the gallbladder. No biliary dilatation is
seen. No focal liver lesion is identified. There is very mild
bilateral adrenal thickening. The spleen and pancreas are
unremarkable. There is moderate bilateral hydroureteronephrosis. No
urinary tract calculi are identified. 5 mm hypodensity in the lower
pole of the left kidney likely represents a cyst. Mildly lobular
contour of the right greater than left kidneys may indicate areas of
scarring.

Oral contrast is present in nondilated loops of small bowel without
evidence obstruction colon is nondilated. No bowel wall thickening
is identified.

There is marked circumferential bladder wall thickening with
surrounding fat stranding. Heterogeneous material is present
posteriorly in the bladder with attenuation slightly greater than
fluid. The uterus is absent. Moderate atherosclerotic disease is
noted of the abdominal aorta its major branch vessels. No free fluid
or enlarged lymph nodes are identified. Mild thoracolumbar
spondylosis is noted. A sacral nerve stimulator is noted.
IMPRESSION: 1. Marked circumferential bladder wall thickening with surrounding
inflammatory change, concerning for cystitis. Heterogeneous material
posteriorly in the bladder may represent infectious debris, however
underlying tumor is not excluded.
2. Resultant moderate bilateral hydroureteronephrosis.
